# Patient Record
Sex: Female | Born: 1995 | Hispanic: Yes | Marital: Single | Smoking: Current every day smoker
Health system: Southern US, Community
[De-identification: ages and names within clinical notes are randomized; demographics above are authoritative.]

---
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
## 2017-10-09 ENCOUNTER — Emergency Department: Payer: Self-pay

## 2017-10-09 ENCOUNTER — Emergency Department
Admission: EM | Admit: 2017-10-09 | Discharge: 2017-10-09 | Disposition: A | Discharge status: Home / Self Care | Payer: Self-pay | Attending: Emergency Medicine | Admitting: Emergency Medicine

## 2017-10-09 ENCOUNTER — Encounter: Payer: Self-pay | Admitting: Emergency Medicine

## 2017-10-09 DIAGNOSIS — Y92410 Unspecified street and highway as the place of occurrence of the external cause: Secondary | ICD-10-CM | POA: Insufficient documentation

## 2017-10-09 DIAGNOSIS — Y998 Other external cause status: Secondary | ICD-10-CM | POA: Insufficient documentation

## 2017-10-09 DIAGNOSIS — S20211A Contusion of right front wall of thorax, initial encounter: Secondary | ICD-10-CM | POA: Insufficient documentation

## 2017-10-09 DIAGNOSIS — Y9389 Activity, other specified: Secondary | ICD-10-CM | POA: Insufficient documentation

## 2017-10-09 DIAGNOSIS — S8012XA Contusion of left lower leg, initial encounter: Secondary | ICD-10-CM | POA: Insufficient documentation

## 2017-10-09 DIAGNOSIS — S80212A Abrasion, left knee, initial encounter: Secondary | ICD-10-CM | POA: Insufficient documentation

## 2017-10-09 MED ORDER — MELOXICAM 15 MG PO TABS: 15.0000 mg | ORAL_TABLET | Freq: Every day | ORAL | 0 refills | Status: AC
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
## 2017-10-09 NOTE — ED Triage Notes (Signed)
Pt with abrasions noted to left knee. No swelling or deformities noted.
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
## 2019-10-19 ENCOUNTER — Other Ambulatory Visit: Payer: Self-pay

## 2019-10-19 ENCOUNTER — Emergency Department
Admission: EM | Admit: 2019-10-19 | Discharge: 2019-10-19 | Disposition: A | Discharge status: Home / Self Care | Payer: No Typology Code available for payment source | Attending: Emergency Medicine | Admitting: Emergency Medicine

## 2019-10-19 ENCOUNTER — Emergency Department: Payer: No Typology Code available for payment source

## 2019-10-19 DIAGNOSIS — S63501A Unspecified sprain of right wrist, initial encounter: Secondary | ICD-10-CM | POA: Diagnosis not present

## 2019-10-19 DIAGNOSIS — S93401A Sprain of unspecified ligament of right ankle, initial encounter: Secondary | ICD-10-CM

## 2019-10-19 DIAGNOSIS — Y999 Unspecified external cause status: Secondary | ICD-10-CM | POA: Insufficient documentation

## 2019-10-19 DIAGNOSIS — S80212A Abrasion, left knee, initial encounter: Secondary | ICD-10-CM | POA: Diagnosis not present

## 2019-10-19 DIAGNOSIS — Y9366 Activity, soccer: Secondary | ICD-10-CM | POA: Insufficient documentation

## 2019-10-19 DIAGNOSIS — W1839XA Other fall on same level, initial encounter: Secondary | ICD-10-CM | POA: Insufficient documentation

## 2019-10-19 DIAGNOSIS — Y92322 Soccer field as the place of occurrence of the external cause: Secondary | ICD-10-CM | POA: Diagnosis not present

## 2019-10-19 DIAGNOSIS — R52 Pain, unspecified: Secondary | ICD-10-CM

## 2019-10-19 DIAGNOSIS — S6991XA Unspecified injury of right wrist, hand and finger(s), initial encounter: Secondary | ICD-10-CM | POA: Diagnosis present

## 2019-10-19 DIAGNOSIS — T1490XA Injury, unspecified, initial encounter: Secondary | ICD-10-CM

## 2019-10-19 MED ORDER — BACITRACIN-NEOMYCIN-POLYMYXIN 400-5-5000 EX OINT
TOPICAL_OINTMENT | Freq: Once | CUTANEOUS | Status: AC
  Administered 2019-10-19: 1 via TOPICAL
  Filled 2019-10-19: qty 1

## 2019-10-19 NOTE — ED Notes (Signed)
Pt states she was playing soccer and was being the goalie. Pt states she tried to stop the ball and it hit her right hand and bent it backwards. Pt states her right ankle hurts to and thinks maybe when she kicked a ball she kicked it wrong.

## 2019-10-19 NOTE — ED Provider Notes (Signed)
[facility] Emergency Department Provider Note ____________________________________________  Time seen: 2045  I have reviewed the triage vital signs and the nursing notes.  HISTORY  Chief Complaint  Wrist Pain and Ankle Pain  HPI Sylvia Crawford is a 24 y.o. female presents herself to the ED for evaluation of injury sustained while playing soccer.  Patient describes a follow-up plan soccer, describes right wrist pain as well as right ankle pain.  She also notes an abrasion to the left knee.  She denies any head injury or other concerning symptoms at this time.   No past medical history on file.  There are no problems to display for this patient.  No past surgical history on file.  Prior to Admission medications   Medication Sig Start Date End Date Taking? Authorizing Provider  meloxicam (MOBIC) 15 MG tablet Take 1 tablet (15 mg total) by mouth daily. 10/09/17   Cuthriell, Delorise Royals, PA-C    Allergies Patient has no known allergies.  No family history on file.  Social History Social History   Tobacco Use  . Smoking status: Not on file  Substance Use Topics  . Alcohol use: Not on file  . Drug use: Not on file    Review of Systems  Constitutional: Negative for fever. Eyes: Negative for visual changes. ENT: Negative for sore throat. Cardiovascular: Negative for chest pain. Respiratory: Negative for shortness of breath. Gastrointestinal: Negative for abdominal pain, vomiting and diarrhea. Genitourinary: Negative for dysuria. Musculoskeletal: Negative for back pain.  Right wrist pain and left ankle pain as above. Skin: Negative for rash.  Left knee abrasion is noted. Neurological: Negative for headaches, focal weakness or numbness. ____________________________________________  PHYSICAL EXAM:  VITAL SIGNS: ED Triage Vitals  Enc Vitals Group     BP 10/19/19 2012 107/68     Pulse Rate 10/19/19 2012 83     Resp 10/19/19 2012 16      Temp 10/19/19 2012 98.4 F (36.9 C)     Temp Source 10/19/19 2012 Oral     SpO2 10/19/19 2012 100 %     Weight 10/19/19 2013 102 lb (46.3 kg)     Height 10/19/19 2013 5\' 4"  (1.626 m)     Head Circumference --      Peak Flow --      Pain Score 10/19/19 2013 7     Pain Loc --      Pain Edu? --      Excl. in GC? --     Constitutional: Alert and oriented. Well appearing and in no distress. Head: Normocephalic and atraumatic. Eyes: Conjunctivae are normal. Normal extraocular movements Neck: Supple. No thyromegaly. Cardiovascular: Normal rate, regular rhythm. Normal distal pulses. Respiratory: Normal respiratory effort. No wheezes/rales/rhonchi. Gastrointestinal: Soft and nontender. No distention. Musculoskeletal: Right wrist without obvious deformity, dislocation, or effusion.  Patient with normal composite fist and normal wrist range of motion.  She is primarily tender over the thumb extensor musculature radially.  The right ankle is also without effusion or deformity.  There is some medial ecchymosis and soft tissue swelling appreciated.  Normal ankle exam without signs of internal derangement.  Normal flexion extension range.  Eversion and inversion is intact.  Negative anterior/posterior drawer.  No calf or Achilles tenderness noted.  Left knee without effusion or deformity.  Normal active range of motion appreciated.  No internal derangement suspected.  Nontender with normal range of motion in all other extremities.  Neurologic: Cranial nerves II through XII grossly intact.  Normal toe dorsiflexion foot eversion.  Normal intrinsic and opposition testing noted. Normal speech and language. No gross focal neurologic deficits are appreciated. Skin:  Skin is warm, dry and intact. No rash noted.  Abrasion to the left knee noted.   Psychiatric: Mood and affect are normal. Patient exhibits appropriate insight and judgment. ____________________________________________   RADIOLOGY  DG Right  Wrist Negative  DG Right Ankle Negative ____________________________________________  PROCEDURES  Ace bandage Right Ankle Wrist cock-up Right Wrist Wound care & dressing to left knee  Procedures ____________________________________________  INITIAL IMPRESSION / ASSESSMENT AND PLAN / ED COURSE  Patient with ED evaluation of injury sustained following a injury while playing soccer.  Patient with abrasion to the left knee, and pain with disability to the right wrist and ankle.  Exam is overall benign reassuring symptoms are consistent with muscle strain and hyperflexion injury.  X-rays negative for any acute fracture or dislocation.  Patient is braced and splinted appropriately, and is referred to Ortho for ongoing symptoms.  She will take over-the-counter ibuprofen or acetaminophen as needed for pain.  She will follow-up as directed or return if needed.  Sylvia Crawford was evaluated in Emergency Department on 10/20/2019 for the symptoms described in the history of present illness. She was evaluated in the context of the global COVID-19 pandemic, which necessitated consideration that the patient might be at risk for infection with the SARS-CoV-2 virus that causes COVID-19. Institutional protocols and algorithms that pertain to the evaluation of patients at risk for COVID-19 are in a state of rapid change based on information released by regulatory bodies including the CDC and federal and state organizations. These policies and algorithms were followed during the patient's care in the ED. ____________________________________________  FINAL CLINICAL IMPRESSION(S) / ED DIAGNOSES  Final diagnoses:  Sprain of right wrist, initial encounter  Sprain of right ankle, unspecified ligament, initial encounter  Abrasion of left knee, initial encounter      Melvenia Needles, PA-C 10/20/19 [place]    Nena Polio, MD 10/22/19 1646

## 2019-10-19 NOTE — ED Notes (Signed)
Applied dressing to L knee abrasion. Neosporin, gauze, and xeroform dressing.

## 2019-10-19 NOTE — Discharge Instructions (Addendum)
Your exam and XRs are normal at this time. There is no evidence of a fracture or dislocation to your wrist or ankle.  You will be treated with a Ace wrap for the ankle and a brace for the wrist.  She will follow-up with Ortho for ongoing symptoms as needed.

## 2019-10-19 NOTE — ED Triage Notes (Signed)
Patient reports fell playing soccer.  Patient reports right wrist pain and right ankle pain.  Patient with noted abrasion to left knee.

## 2020-04-28 ENCOUNTER — Encounter: Payer: Self-pay | Admitting: Advanced Practice Midwife

## 2020-04-28 ENCOUNTER — Other Ambulatory Visit: Payer: Self-pay

## 2020-04-28 ENCOUNTER — Ambulatory Visit (LOCAL_COMMUNITY_HEALTH_CENTER): Payer: PRIVATE HEALTH INSURANCE | Admitting: Advanced Practice Midwife

## 2020-04-28 VITALS — BP 114/80 | Ht 63.0 in | Wt 97.6 lb

## 2020-04-28 DIAGNOSIS — F1290 Cannabis use, unspecified, uncomplicated: Secondary | ICD-10-CM | POA: Insufficient documentation

## 2020-04-28 DIAGNOSIS — Z30013 Encounter for initial prescription of injectable contraceptive: Secondary | ICD-10-CM

## 2020-04-28 DIAGNOSIS — Z720 Tobacco use: Secondary | ICD-10-CM | POA: Insufficient documentation

## 2020-04-28 DIAGNOSIS — Z3009 Encounter for other general counseling and advice on contraception: Secondary | ICD-10-CM

## 2020-04-28 MED ORDER — MEDROXYPROGESTERONE ACETATE 150 MG/ML IM SUSP
150.0000 mg | Freq: Once | INTRAMUSCULAR | Status: AC
  Administered 2020-04-28: 150 mg via INTRAMUSCULAR

## 2020-04-28 NOTE — Progress Notes (Signed)
[facility] DEPARTMENT [facility] [address] Main Number: [phone]    Family Planning Visit- Initial Visit  Subjective:  Sylvia Crawford is a 24 y.o. engaged HF vaper [id number]   being seen today for an initial well woman visit and to discuss family planning options.  She is currently using None for pregnancy prevention. Patient reports she does not want a pregnancy in the next year.  Patient has the following medical conditions has Nicotine vapor product user and Marijuana use on their problem list.  Chief Complaint  Patient presents with  . Annual Exam  . Contraception    Patient reports wants DMPA.  LMP 04/25/20.  Last sex 05/2019 without condom; with current partner x 4 years and engaged to be married 05/09/20; 1 partner in last 3 mo.  Onset coitus age 46 with 6 lifetime partners.  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  Living alone.  Last MJ 02/2020.  Vaped today.  Last ETOH 04/25/20 (1 beer) 1x/mo.   Patient denies cigs, cigars  Body mass index is 17.29 kg/m. - Patient is eligible for diabetes screening based on BMI and age >77?  not applicable HA1C ordered? not applicable  Patient reports 1  partner/s in last year. Desires STI screening?  Yes  Has patient been screened once for HCV in the past?  No  No results found for: HCVAB  Does the patient have current drug use (including MJ), have a partner with drug use, and/or has been incarcerated since last result? Yes  If yes-- Screen for HCV through [facility]   Does the patient meet criteria for HBV testing? No  Criteria:  -Household, sexual or needle sharing contact with HBV -History of drug use -HIV positive -Those with known Hep C   Health Maintenance Due  Topic Date Due  . Hepatitis C Screening  Never done  . COVID-19 Vaccine (1) Never done  . HIV Screening  Never done  . TETANUS/TDAP  Never done  . PAP-Cervical Cytology  Screening  Never done  . PAP SMEAR-Modifier  Never done  . INFLUENZA VACCINE  Never done    Review of Systems  All other systems reviewed and are negative.   The following portions of the patient's history were reviewed and updated as appropriate: allergies, current medications, past family history, past medical history, past social history, past surgical history and problem list. Problem list updated.   See flowsheet for other program required questions.  Objective:   Vitals:   04/28/20 1622  BP: 114/80  Weight: 97 lb 9.6 oz (44.3 kg)  Height: 5\' 3"  (1.6 m)    Physical Exam Constitutional:      Appearance: Normal appearance. She is normal weight.  HENT:     Head: Normocephalic and atraumatic.     Mouth/Throat:     Mouth: Mucous membranes are moist.  Eyes:     Conjunctiva/sclera: Conjunctivae normal.  Cardiovascular:     Rate and Rhythm: Normal rate and regular rhythm.  Pulmonary:     Effort: Pulmonary effort is normal.     Breath sounds: Normal breath sounds.  Chest:  Breasts:     Right: Normal.     Left: Normal.    Abdominal:     Palpations: Abdomen is soft.     Comments: Soft, good tone without masses or tenderness  Genitourinary:    General: Normal vulva.     Exam position:  Lithotomy position.     Vagina: Bleeding (red menses blood) present.     Cervix: Normal.     Uterus: Normal.      Adnexa: Right adnexa normal and left adnexa normal.     Rectum: Normal.     Comments: Small round polyp at posterior aspect of introitus without tenderness Musculoskeletal:        General: Normal range of motion.     Cervical back: Normal range of motion and neck supple.  Skin:    General: Skin is warm and dry.  Neurological:     Mental Status: She is alert.  Psychiatric:        Mood and Affect: Mood normal.       Assessment and Plan:  Sylvia Crawford is a 24 y.o. female presenting to the [facility] Department for an initial well woman  exam/family planning visit  Contraception counseling: Reviewed all forms of birth control options in the tiered based approach. available including abstinence; over the counter/barrier methods; hormonal contraceptive medication including pill, patch, ring, injection,contraceptive implant, ECP; hormonal and nonhormonal IUDs; permanent sterilization options including vasectomy and the various tubal sterilization modalities. Risks, benefits, and typical effectiveness rates were reviewed.  Questions were answered.  Written information was also given to the patient to review.  Patient desires DMPA, this was prescribed for patient. She will follow up in 11-13 wks for surveillance.  She was told to call with any further questions, or with any concerns about this method of contraception.  Emphasized use of condoms 100% of the time for STI prevention.  Patient was not offered  ECP was not accepted by the patient. ECP counseling was not given - see RN documentation  1. Family planning Treat wet mount per standing orders Immunization nurse consult - WET PREP FOR TRICH, YEAST, CLUE - Chlamydia/Gonorrhea [facility] - Pap IG (Image Guided)  2. Encounter for initial prescription of injectable contraceptive DMPA 150 mg IM q 11-13 wks x 1 year Please couonsel on need for abstinance/back up condoms next 7 days  3. Nicotine vapor product user Counseled via 5 A's to stop vaping  4. Marijuana use Counseled not to use     No follow-ups on file.  No future appointments.  Alberteen Spindle, CNM

## 2020-04-28 NOTE — Progress Notes (Signed)
Wet mount reviewed, no treatment indicated. Depo given, tolerated well, next Depo card given.Burt Knack, RN

## 2020-04-28 NOTE — Addendum Note (Signed)
Addended by: Burt Knack on: 04/28/2020 05:20 PM   Modules accepted: Orders

## 2020-04-29 LAB — WET PREP FOR TRICH, YEAST, CLUE
Trichomonas Exam: NEGATIVE
Yeast Exam: NEGATIVE

## 2020-05-02 LAB — PAP IG (IMAGE GUIDED): PAP Smear Comment: 0

## 2021-01-11 IMAGING — DX DG WRIST COMPLETE 3+V*R*
4 series · 4 of 4 positions shown · non-contrast
Comparison: None.

CLINICAL DATA: Status post fall.

EXAM:
RIGHT WRIST - COMPLETE 3+ VIEW

[wrist obl]
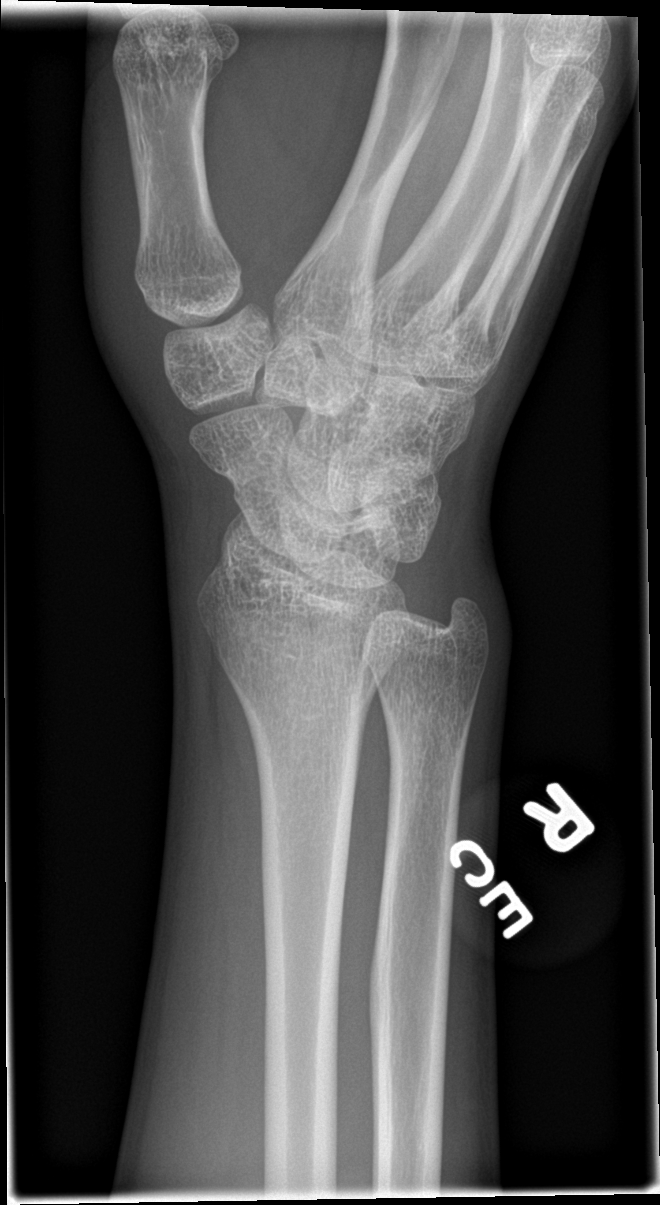

[wrist lat]
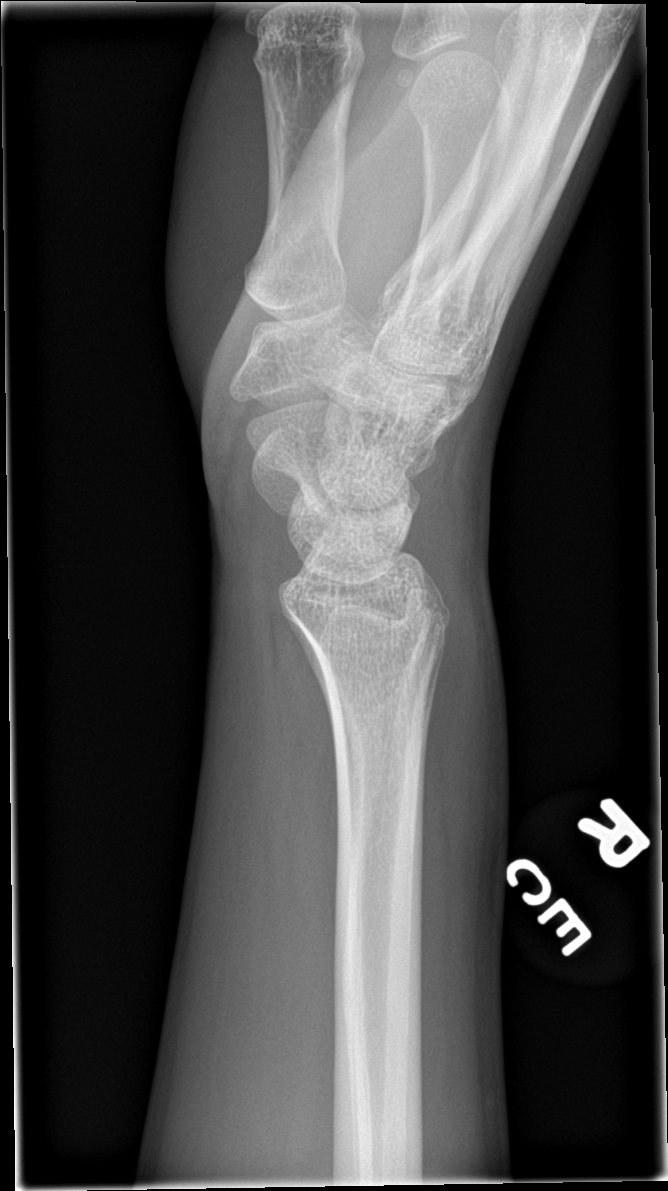

[ankle ap]
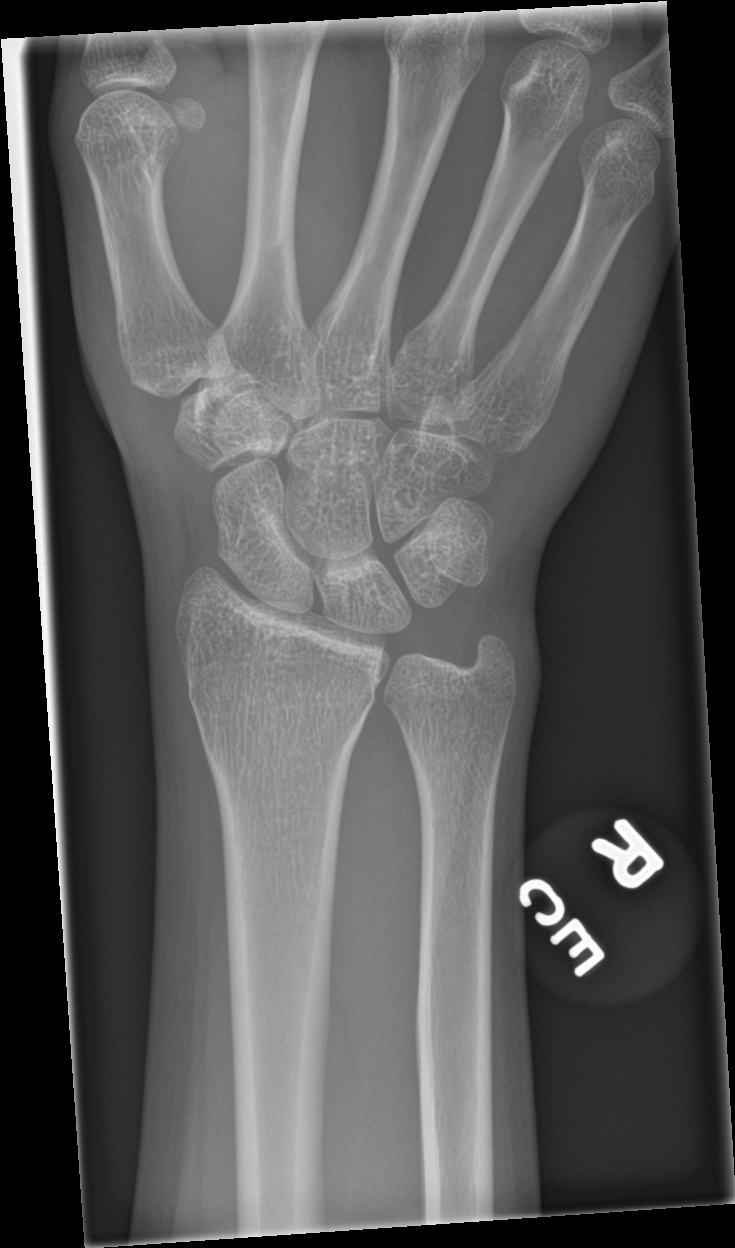

[wrist ap]
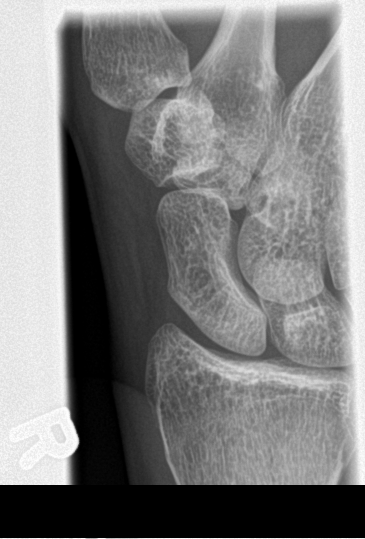

[4 of 4 positions shown; findings below may reference images not displayed]

FINDINGS: There is no evidence of fracture or dislocation. There is no
evidence of arthropathy or other focal bone abnormality. Mild
diffuse soft tissue swelling is seen.
IMPRESSION: No acute osseous abnormality.
